# Patient Record
Sex: Male | Born: 1985 | Race: White | Hispanic: No | Marital: Single | Smoking: Former smoker
Health system: Southern US, Community
[De-identification: ages and names within clinical notes are randomized; demographics above are authoritative.]

---
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
## 2013-10-01 ENCOUNTER — Emergency Department (HOSPITAL_COMMUNITY): Payer: Self-pay

## 2013-10-01 ENCOUNTER — Encounter (HOSPITAL_COMMUNITY): Payer: Self-pay | Admitting: Emergency Medicine

## 2013-10-01 ENCOUNTER — Emergency Department (HOSPITAL_COMMUNITY)
Admission: EM | Admit: 2013-10-01 | Discharge: 2013-10-02 | Disposition: A | Discharge status: Home / Self Care | Payer: Self-pay | Attending: Emergency Medicine | Admitting: Emergency Medicine

## 2013-10-01 DIAGNOSIS — Z87891 Personal history of nicotine dependence: Secondary | ICD-10-CM | POA: Insufficient documentation

## 2013-10-01 DIAGNOSIS — Y929 Unspecified place or not applicable: Secondary | ICD-10-CM | POA: Insufficient documentation

## 2013-10-01 DIAGNOSIS — Y9351 Activity, roller skating (inline) and skateboarding: Secondary | ICD-10-CM | POA: Insufficient documentation

## 2013-10-01 DIAGNOSIS — S42002A Fracture of unspecified part of left clavicle, initial encounter for closed fracture: Secondary | ICD-10-CM

## 2013-10-01 DIAGNOSIS — S42023A Displaced fracture of shaft of unspecified clavicle, initial encounter for closed fracture: Secondary | ICD-10-CM | POA: Insufficient documentation

## 2013-10-01 DIAGNOSIS — Z23 Encounter for immunization: Secondary | ICD-10-CM | POA: Insufficient documentation

## 2013-10-01 NOTE — ED Notes (Signed)
Pt states he wrecked his skateboard tonight and thinks he broke his left clavicle  Pt also has road rash noted

## 2013-10-02 MED ORDER — OXYCODONE-ACETAMINOPHEN 5-325 MG PO TABS: 1.0000 | ORAL_TABLET | ORAL | Status: DC | PRN

## 2013-10-02 MED ORDER — OXYCODONE-ACETAMINOPHEN 5-325 MG PO TABS
1.0000 | ORAL_TABLET | Freq: Once | ORAL | Status: AC
  Administered 2013-10-02: 1 via ORAL
  Filled 2013-10-02: qty 1

## 2013-10-02 MED ORDER — TETANUS-DIPHTH-ACELL PERTUSSIS 5-2.5-18.5 LF-MCG/0.5 IM SUSP
0.5000 mL | Freq: Once | INTRAMUSCULAR | Status: AC
  Administered 2013-10-02: 0.5 mL via INTRAMUSCULAR
  Filled 2013-10-02: qty 0.5

## 2013-10-02 NOTE — ED Provider Notes (Signed)
Medical screening examination/treatment/procedure(s) were performed by non-physician practitioner and as supervising physician I was immediately available for consultation/collaboration.   EKG Interpretation None        Garl Speigner, MD 10/02/13 2220 

## 2013-10-02 NOTE — ED Notes (Signed)
Pt has a ride home.  

## 2013-10-02 NOTE — ED Provider Notes (Signed)
CSN: [id number]     Arrival date & time 10/01/13  2200 History   First MD Initiated Contact with Patient 10/01/13 2314     Chief Complaint  Patient presents with  . Shoulder Injury     (Consider location/radiation/quality/duration/timing/severity/associated sxs/prior Treatment) HPI History provided by pt.   Pt fell off of his skateboard this evening and fell onto his anterior L shoulder.  Did not his his head and denies neck/back pain.  Developed severe L clavicle/shoulder pain shortly after injury.  Pain aggravated by minimal ROM. Has not taken anything for pain.  No associated LUE weakness/paresthesias.  Has several abrasions.  Most recent tetanus unknown.  History reviewed. No pertinent past medical history. History reviewed. No pertinent past surgical history. History reviewed. No pertinent family history. History  Substance Use Topics  . Smoking status: Former Games developer  . Smokeless tobacco: Not on file  . Alcohol Use: No    Review of Systems    Allergies  Review of patient's allergies indicates no known allergies.  Home Medications   Current Outpatient Rx  Name  Route  Sig  Dispense  Refill  . oxyCODONE-acetaminophen (PERCOCET/ROXICET) 5-325 MG per tablet   Oral   Take 1 tablet by mouth every 4 (four) hours as needed for severe pain.   20 tablet   0    BP 102/91  Pulse 95  Temp(Src) 98.2 F (36.8 C) (Oral)  Resp 20  SpO2 100% Physical Exam  Nursing note and vitals reviewed. Constitutional: He is oriented to person, place, and time. He appears well-developed and well-nourished. No distress.  HENT:  Head: Normocephalic and atraumatic.  Eyes:  Normal appearance  Neck: Normal range of motion.  Pulmonary/Chest: Effort normal.  Musculoskeletal: Normal range of motion.  Entire spine non-tender.  Clavicles asymmetric.  Tenderness L clavicle w/ guarding.  Tenderness L anterior shoulder as well. Pain w/ minimal passive flexion/abduction L shoulder.  Nml elbow and  wrist.  2+ radial pulse and distal sensation intact.      Neurological: He is alert and oriented to person, place, and time.  Skin:  Several superficial hemostatic abrasions  Psychiatric: He has a normal mood and affect. His behavior is normal.    ED Course  Procedures (including critical care time) Labs Review Labs Reviewed - No data to display Imaging Review Dg Clavicle Left  10/02/2013   CLINICAL DATA:  Fall.  Left shoulder pain and area of clavicle.  EXAM: LEFT CLAVICLE - 2+ VIEWS  COMPARISON:  None.  FINDINGS: There is a minimally displaced, mildly angulated fracture of the midshaft of the clavicle. Acromioclavicular joint remains approximated. Visualized portions of the lungs are clear.  IMPRESSION: Minimally displaced and angulated midshaft left clavicle fracture.   Electronically Signed   By: Sebastian Ache   On: 10/02/2013 00:47   Dg Shoulder Left  10/01/2013   CLINICAL DATA:  Fall, left shoulder pain  EXAM: LEFT SHOULDER - 2+ VIEW  COMPARISON:  None available  FINDINGS: There is no evidence of fracture or dislocation. There is no evidence of arthropathy or other focal bone abnormality. Soft tissues are unremarkable.  IMPRESSION: No acute traumatic injury about the shoulder.   Electronically Signed   By: Rise Mu M.D.   On: 10/01/2013 22:52     EKG Interpretation None      MDM   Final diagnoses:  Fracture of left clavicle    28yo M presents w/ L shoulder pain s/p fall from skateboard this evening.  Xray  shows L mid-clavicle fx w/ angulation.  Nursing staff provided him w/ shoulder sling.  I prescribed percocet for pain and recommended ice and ibuprofen.  Advised f/u w/ ortho.  Tetanus updated.      Otilio Miuatherine E Danine Hor, PA-C 10/02/13 [phone]

## 2013-10-02 NOTE — Discharge Instructions (Signed)
Take percocet as needed for severe pain.  Do not drive within four hours of taking this medication (may cause drowsiness or confusion).    Ice 2-3 times a day for 15-20 minutes.  Follow up with Dr. Shon BatonBrooks as soon as your able.   You may return to the ER if symptoms worsen or you have any other concerns.    Clavicle Fracture A clavicle fracture is a break in the collarbone. This is a common injury, especially in children. Collarbones do not harden until around the age of 27. Most collarbone fractures are treated with a simple arm sling. In some cases a figure-of-eight splint is used to help hold the broken bones in position. Although not often needed, surgery may be required if the bone fragments are not in the correct position (displaced).  HOME CARE INSTRUCTIONS   Apply ice to the injury for 15-20 minutes each hour while awake for 2 days. Put the ice in a plastic bag and place a towel between the bag of ice and your skin.  Wear the sling or splint constantly for as long as directed by your caregiver. You may remove the sling or splint for bathing or showering. Be sure to keep your shoulder in the same place as when the sling or splint is on. Do not lift your arm.  If a figure-of-eight splint is applied, it must be tightened by another person every day. Tighten it enough to keep the shoulders held back. Allow enough room to place the index finger between the body and strap. Loosen the splint immediately if you feel numbness or tingling in your hands.  Only take over-the-counter or prescription medicines for pain, discomfort, or fever as directed by your caregiver.  Avoid activities that irritate or increase the pain for 4 to 6 weeks after surgery.  Follow all instructions for follow-up with your caregiver. This includes any referrals, physical therapy, and rehabilitation. Any delay in obtaining necessary care could result in a delay or failure of the injury to heal properly. SEEK MEDICAL CARE IF:    You have pain and swelling that are not relieved with medications. SEEK IMMEDIATE MEDICAL CARE IF:  Your arm is numb, cold, or pale, even when the splint is loose. MAKE SURE YOU:   Understand these instructions.  Will watch your condition.  Will get help right away if you are not doing well or get worse. Document Released: 04/04/2005 Document Revised: 09/17/2011 Document Reviewed: 01/29/2008 [facility] Patient Information 2014 [place], [place].

## 2014-04-27 ENCOUNTER — Emergency Department (HOSPITAL_COMMUNITY)
Admission: EM | Admit: 2014-04-27 | Discharge: 2014-04-27 | Disposition: A | Discharge status: Home / Self Care | Payer: Self-pay | Attending: Emergency Medicine | Admitting: Emergency Medicine

## 2014-04-27 ENCOUNTER — Encounter (HOSPITAL_COMMUNITY): Payer: Self-pay | Admitting: Emergency Medicine

## 2014-04-27 ENCOUNTER — Emergency Department (HOSPITAL_COMMUNITY): Payer: Self-pay

## 2014-04-27 DIAGNOSIS — Z720 Tobacco use: Secondary | ICD-10-CM | POA: Insufficient documentation

## 2014-04-27 DIAGNOSIS — Y990 Civilian activity done for income or pay: Secondary | ICD-10-CM | POA: Insufficient documentation

## 2014-04-27 DIAGNOSIS — Y9289 Other specified places as the place of occurrence of the external cause: Secondary | ICD-10-CM | POA: Insufficient documentation

## 2014-04-27 DIAGNOSIS — Y93G1 Activity, food preparation and clean up: Secondary | ICD-10-CM | POA: Insufficient documentation

## 2014-04-27 DIAGNOSIS — S161XXA Strain of muscle, fascia and tendon at neck level, initial encounter: Secondary | ICD-10-CM | POA: Insufficient documentation

## 2014-04-27 DIAGNOSIS — X58XXXA Exposure to other specified factors, initial encounter: Secondary | ICD-10-CM | POA: Insufficient documentation

## 2014-04-27 MED ORDER — IBUPROFEN 800 MG PO TABS: 800.0000 mg | ORAL_TABLET | Freq: Once | ORAL | Status: DC

## 2014-04-27 MED ORDER — CYCLOBENZAPRINE HCL 10 MG PO TABS: 10.0000 mg | ORAL_TABLET | Freq: Two times a day (BID) | ORAL | Status: DC | PRN

## 2014-04-27 MED ORDER — KETOROLAC TROMETHAMINE 60 MG/2ML IM SOLN
60.0000 mg | Freq: Once | INTRAMUSCULAR | Status: AC
  Administered 2014-04-27: 60 mg via INTRAMUSCULAR
  Filled 2014-04-27: qty 2

## 2014-04-27 MED ORDER — NAPROXEN 500 MG PO TABS: 500.0000 mg | ORAL_TABLET | Freq: Two times a day (BID) | ORAL | Status: DC

## 2014-04-27 MED ORDER — DIAZEPAM 5 MG PO TABS
5.0000 mg | ORAL_TABLET | Freq: Once | ORAL | Status: AC
  Administered 2014-04-27: 5 mg via ORAL
  Filled 2014-04-27: qty 1

## 2014-04-27 MED ORDER — TRAMADOL HCL 50 MG PO TABS: 50.0000 mg | ORAL_TABLET | Freq: Four times a day (QID) | ORAL | Status: DC | PRN

## 2014-04-27 NOTE — ED Provider Notes (Signed)
CSN: [id number]     Arrival date & time 04/27/14  1502 History  This chart was scribed for Ronald Pena A Danton Palmateer, PA with Richardean Canalavid H Yao, MD by Tonye RoyaltyJoshua Chen, ED Scribe. This patient was seen in room WTR6/WTR6 and the patient's care was started at 3:47 PM.    Chief Complaint  Patient presents with  . Back Pain   The history is provided by the patient. No language interpreter was used.   HPI Comments: Ronald Pena is a 28 y.o. male who presents to the Emergency Department complaining of pain to his upper back and right shoulder area with onset 1 week ago. 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 He states pain is worse with movement of his arms or turning his neck. He states pain is better after sleeping but worsens over the course of the day. He states pain was worst on Friday when he had to work. He states a massage caused his muscles to spasm which continued for 4-5 hours. He states he had Sunday off and worked some on Monday, but was not able to finish his shift. He states he has not taken any medication besides for Flexeril to help him sleep. He denies or numbness pain to his legs.   History reviewed. No pertinent past medical history. History reviewed. No pertinent past surgical history. No family history on file. History  Substance Use Topics  . Smoking status: Current Every Day Smoker  . Smokeless tobacco: Not on file  . Alcohol Use: Yes     Comment: occa    Review of Systems  Musculoskeletal: Positive for back pain and neck pain.  Neurological: Negative for numbness.      Allergies  Review of patient's allergies indicates no known allergies.  Home Medications   Prior to Admission medications   Medication Sig Start Date End Date Taking? Authorizing Provider  oxyCODONE-acetaminophen (PERCOCET/ROXICET) 5-325 MG per tablet Take 1 tablet by mouth every 4 (four) hours as needed for severe pain. 10/02/13   Ronald Pena  E Schinlever, PA-C   BP 114/67  Pulse 94  Temp(Src) 98 F (36.7 C) (Oral)  Resp 20  SpO2 98% Physical Exam  Nursing note and vitals reviewed. Constitutional: He is oriented to person, place, and time. He appears well-developed and well-nourished.  HENT:  Head: Normocephalic and atraumatic.  Eyes: Conjunctivae are normal.  Neck: Normal range of motion. Neck supple.  Pulmonary/Chest: Effort normal.  Musculoskeletal: Normal range of motion.  Lower cervical spine tenderness, tenderness in over midline and over bilateral paraspinal muscles. Tenderness is mainly over C4-C5. Full range of motion of bilateral arms at shoulder joint. No pain with full flexion, abduction, no pain with internal/external rotation. Distal radial pulses are intact and equal bilaterally.  Neurological: He is alert and oriented to person, place, and time.  Tablet of 5 and equal bilateral grip strength. 5 out of 5 and equal bilateral strength in deltoids, biceps, triceps. Sensation intact over all dermatomes of the upper extremity  Skin: Skin is warm and dry.  Psychiatric: He has a normal mood and affect.    ED Course  Procedures (including critical care time) Labs Review Labs Reviewed - No data to display  Imaging Review No results found.   EKG Interpretation None     DIAGNOSTIC STUDIES: Oxygen Saturation is 98% on room air, normal by my interpretation.    COORDINATION OF CARE: 3:54 PM Discussed treatment plan  with patient at beside, the patient agrees with the plan and has no further questions at this time.   MDM   Final diagnoses:  Cervical strain, initial encounter   Patient with nontraumatic pain to the lower neck area. Tender midline and paravertebral. X-rays obtained and are negative. Suspect possibly muscle spasms. We'll try Flexeril, naproxen, Ultram. Followup with primary care Dr. if this is not improving. I this time neurovascularly intact, no indication for any further imaging.  Filed  Vitals:   04/27/14 1521 04/27/14 1809  BP: 114/67   Pulse: 94 80  Temp: 98 F (36.7 C)   TempSrc: Oral   Resp: 20   SpO2: 98% 96%     I personally performed the services described in this documentation, which was scribed in my presence. The recorded information has been reviewed and is accurate.    Ronald Pena A Bynum Mccullars, PA-C 04/27/14 1932

## 2014-04-27 NOTE — Discharge Instructions (Signed)
Naprosyn for pain and inflammation. Ultram for severe pain. Flexeril for spasms. Follow up with your doctor if your symptoms not improving for further evaluation and treatment.    Muscle Cramps and Spasms Muscle cramps and spasms occur when a muscle or muscles tighten and you have no control over this tightening (involuntary muscle contraction). They are a common problem and can develop in any muscle. The most common place is in the calf muscles of the leg. Both muscle cramps and muscle spasms are involuntary muscle contractions, but they also have differences:   Muscle cramps are sporadic and painful. They may last a few seconds to a quarter of an hour. Muscle cramps are often more forceful and last longer than muscle spasms.  Muscle spasms may or may not be painful. They may also last just a few seconds or much longer. CAUSES  It is uncommon for cramps or spasms to be due to a serious underlying problem. In many cases, the cause of cramps or spasms is unknown. Some common causes are:   Overexertion.   Overuse from repetitive motions (doing the same thing over and over).   Remaining in a certain position for a long period of time.   Improper preparation, form, or technique while performing a sport or activity.   Dehydration.   Injury.   Side effects of some medicines.   Abnormally low levels of the salts and ions in your blood (electrolytes), especially potassium and calcium. This could happen if you are taking water pills (diuretics) or you are pregnant.  Some underlying medical problems can make it more likely to develop cramps or spasms. These include, but are not limited to:   Diabetes.   Parkinson disease.   Hormone disorders, such as thyroid problems.   Alcohol abuse.   Diseases specific to muscles, joints, and bones.   Blood vessel disease where not enough blood is getting to the muscles.  HOME CARE INSTRUCTIONS   Stay well hydrated. Drink enough water  and fluids to keep your urine clear or pale yellow.  It may be helpful to massage, stretch, and relax the affected muscle.  For tight or tense muscles, use a warm towel, heating pad, or hot shower water directed to the affected area.  If you are sore or have pain after a cramp or spasm, applying ice to the affected area may relieve discomfort.  Put ice in a plastic bag.  Place a towel between your skin and the bag.  Leave the ice on for 15-20 minutes, 03-04 times a day.  Medicines used to treat a known cause of cramps or spasms may help reduce their frequency or severity. Only take over-the-counter or prescription medicines as directed by your caregiver. SEEK MEDICAL CARE IF:  Your cramps or spasms get more severe, more frequent, or do not improve over time.  MAKE SURE YOU:   Understand these instructions.  Will watch your condition.  Will get help right away if you are not doing well or get worse. Document Released: 12/15/2001 Document Revised: 10/20/2012 Document Reviewed: 06/11/2012 [facility] Patient Information 2015 [place], [place]. This information is not intended to replace advice given to you by your health care provider. Make sure you discuss any questions you have with your health care provider.   Emergency Department Resource Guide 1) Find a Doctor and Pay Out of Pocket Although you won't have to find out who is covered by your insurance plan, it is a good idea to ask around and get recommendations.  You will then need to call the office and see if the doctor you have chosen will accept you as a new patient and what types of options they offer for patients who are self-pay. Some doctors offer discounts or will set up payment plans for their patients who do not have insurance, but you will need to ask so you aren't surprised when you get to your appointment.  2) Contact Your Local Health Department Not all health departments have doctors that can see patients for sick visits,  but many do, so it is worth a call to see if yours does. If you don't know where your local health department is, you can check in your phone book. The CDC also has a tool to help you locate your state's health department, and many state websites also have listings of all of their local health departments.  3) Find a Walk-in Clinic If your illness is not likely to be very severe or complicated, you may want to try a walk in clinic. These are popping up all over the country in pharmacies, drugstores, and shopping centers. They're usually staffed by nurse practitioners or physician assistants that have been trained to treat common illnesses and complaints. They're usually fairly quick and inexpensive. However, if you have serious medical issues or chronic medical problems, these are probably not your best option.  No Primary Care Doctor: - Call Health Connect at  [phone] - they can help you locate a primary care doctor that  accepts your insurance, provides certain services, etc. - Physician Referral Service- [phone]  Chronic Pain Problems: Organization         Address  Phone   Notes  [facility]  [phone] Patients need to be referred by their primary care doctor.   Medication Assistance: Organization         Address  Phone   Notes  [facility] Medication [facility] [address] [place] [place], [place] [address] [phone] --Must be a resident of [facility] -- Must have NO insurance coverage whatsoever (no Medicaid/ Medicare, etc.) -- The pt. MUST have a primary care doctor that directs their care regularly and follows them in the community   [facility]ist  [phone]   [employer]  [phone]    Agencies that provide inexpensive medical care: Organization         Address  Phone   Notes  [facility]  [phone]   [facility]    [phone]   [facility] [address] [place], [place] [address] [phone]   [facility] [address] [place] [phone]   Planned Parenthood    [phone]   [facility]    [phone]   Community Health and [facility]  [address], [place] Phone:  [phone], Fax:  [phone] Hours of Operation:  9 am - 6 pm, M-F.  Also accepts Medicaid/Medicare and self-pay.  [facility]  [address], [place] Phone: [phone], Fax: [phone]. Hours of Operation:  8:30 am - 5:30 pm, M-F.  Also accepts Medicaid and self-pay.  [facility] [place] [address], [place] Phone: [phone]   [facility] [address] Natasha Bence [place], [place] [phone], Ext. [phone] Mondays & Thursdays: 7-9 AM.  First 15 patients are seen on a first come,  first serve basis.    Medicaid-accepting [facility] Providers:  Organization         Address  Phone   Notes  [facility] [address], [place] [phone] Also accepts self-pay patients.  [facility] [address] Laurell Josephs [place], [place]  [phone]   [facility] [address], [place] [phone]   [facility] [address], [place] [phone]   Renaye Rakers [address], [place]   [phone] Only accepts [place] patients after they have their name applied to their card.   Self-Pay (no insurance) in [facility]:  Organization         Address  Phone   Notes  Sickle Cell Patients, [facility] [address] [place], [place] [phone]   [facility] [address] [place], [place] [phone]   [facility]  [address], [place] [phone]   [facility]/Dr. Osei-Bonsu  [address],  [place] or [address], [place] [phone] Phone number for both [place] and [place] locations is the same.  [facility]gent Medical and [facility] [address], [place] [phone]   [facility] [address], [place] or [address] [phone]   [facility] [address], [place] [phone], phone; [phone], fax Sees patients 1st and 3rd Saturday of every month.  Must not qualify for public or private insurance (i.e. Medicaid, Medicare, [place] Health Choice, Veterans' Benefits)  Household income should be no more than 200% of the poverty level The clinic cannot treat you if you are pregnant or think you are pregnant  Sexually transmitted diseases are not treated at the clinic.    Dental Care: Organization         Address  Phone  Notes  [facility] [address] [place], [place] [phone] Accepts children up to age 60 who are enrolled in [place] or [place] Health Choice; pregnant women with a Medicaid card; and children who have applied for Medicaid or [place] Health Choice, but were declined, whose parents can pay a reduced fee at time of service.  [facility] Department of [facility]  [address], [place] [phone] Accepts children up to age 71 who are enrolled in [place] or [place] Health Choice; pregnant women with a Medicaid card; and children who have applied for Medicaid or [place] Health Choice, but were declined, whose parents can pay a reduced fee at time of service.  [facility]  [address] [place], [place] [phone] Patients are seen by appointment only. Walk-ins are not accepted. [facility] will see patients 48 years of age and older. Monday - Tuesday (8am-5pm) Most Wednesdays (8:30-5pm) $30 per visit, cash only  [facility]  [address], [facility] [phone] Patients are seen by appointment only. Walk-ins are not accepted. [facility] will see patients 1 years of age and older. One Wednesday Evening (Monthly: Volunteer Based).  $30 per visit, cash only  [employer]  [phone] for adults; Children under age 40, call Graduate Pediatric Dentistry at [phone]. Children aged 52-14, please  call [phone]) [phone] to request a pediatric application.  Dental services are provided in all areas of dental care including fillings, crowns and bridges, complete and partial dentures, implants, gum treatment, root canals, and extractions. Preventive care is also provided. Treatment is provided to both adults and children. Patients are selected via a lottery and there is often a waiting list.   [facility] [address], [place]  [phone]) [phone] [email or web address]   [facility] [address], [place], [place] [phone], Ext. [phone] Second and Fourth Thursday of each month, opens at 6:30 AM; Clinic ends at 9 AM.  Patients are seen on a first-come first-served basis, and a limited number are seen during each clinic.   [facility]  [address], [place], [place] [phone]   Eligibility Requirements You must have lived in [place], [place], or [place] for at least the last three months.   You cannot be eligible for state or federal sponsored [place] Cityhealthcare insurance, including CIGNAVeterans Administration, [place]icaid, or [employer]icare.   You generally cannot be eligible for healthcare insurance through your employer.    How to apply: Eligibility screenings are held every Tuesday and Wednesday afternoon from 1:00 pm until 4:00 pm. You do not need an appointment for the interview!  [facility] [address], [place], [place] [phone]   [facility]  [phone]   [facility]  [phone]   [facility]  [phone]    Behavioral Health Resources in the Community: Intensive Outpatient Programs Organization         Address  Phone  Notes  [facility] [address], [place], [place] [phone]   [facility] [address], [place], [place] [phone]   [facility] [address], [place], [place]  [phone]   [facility] [address],  [place], [place] [phone] or [phone]   Substance Abuse Resources Organization         Address  Phone  Notes  [facility]  [phone]   [facility]  [phone]   [facility]  [phone]   [facility]  [phone]   Residential & Outpatient Substance Abuse Program  [phone]   Psychological Services Organization         Address  Phone  Notes  [facility]  [phone]   [facility]  [phone]- [phone]   [facility] [address], [place] [phone] or [phone]    Mobile Crisis Teams Organization         Address  Phone  Notes  [facility], Mobile Crisis Care Unit  [phone]   [facility]  [address] [place], [place] [phone]   Doristine LocksSharon DeEsch [address] [place] [phone]    Self-Help/Support Groups Organization         Address  Phone             Notes  [facility] [place] - variety of support groups  336- [phone] Call for more information  Narcotics Anonymous (NA), [facility] [address], [place]  2 meetings at this location   Statisticianesidential Treatment Programs Organization         Address  Phone  Notes  [facility] [address],    [place]  [phone]   [facility]  [phone], [place], [place] [phone]   [facility] [address], [place]  [phone] Admissions: 8am-3pm M-F  [facility] [address]    [place], [place] [phone]   [facility] [address], [place], [place] [phone]   [facility] [address]  [place], [place] [phone]   [facility] - Intensive Outpatient [address], [place], [place] [phone]   [facility] ([facility] [address]  [place], [place] [phone] or [phone]   [facility] ([facility]) [address] [place], [place] [phone] Accepts Medicaid  [facility] [address]  [place] [phone] Substance Abuse/Addiction Treatment   [facility] Resources Organization         Address  Phone  Notes  [facility]  [phone](888) [phone]   Angie FavaJulie Brannon, PhD [address], [address] [place], [place]   [phone] or [phone]   [facility]   [address] [place], [place] [phone]   [facility] [address], [place], [place] [phone]) [phone] Insurance/Medicaid/sponsorship through [facility]  [facility] Families [address]                                    [place], [place] [phone] Therapy/tele-psych/case  [facility] [address]   [place], [place] [phone]    Dr. Lolly MustacheArfeen  [phone]   [facility]  [employer] [facility] 1) [address], [place] 2) [address], [place] 3)  [address], [place] [phone](336) [phone]  [phone](336) [phone]   [facility] Child Abuse Hotline [phone]) [phone] or [phone](336) [phone] (After Hours)

## 2014-04-27 NOTE — ED Provider Notes (Signed)
Medical screening examination/treatment/procedure(s) were performed by non-physician practitioner and as supervising physician I was immediately available for consultation/collaboration.   EKG Interpretation None        Richardean Canalavid H Yao, MD 04/27/14 2334

## 2014-04-27 NOTE — ED Notes (Signed)
Pt reports upper back pain x 1 week, denies any injury.  Pt reports it could be a pinch nerve.  Denies any tingling or numbness of his hands at this time

## 2014-11-24 ENCOUNTER — Encounter (HOSPITAL_COMMUNITY): Payer: Self-pay | Admitting: Emergency Medicine

## 2014-11-24 ENCOUNTER — Emergency Department (HOSPITAL_COMMUNITY)
Admission: EM | Admit: 2014-11-24 | Discharge: 2014-11-24 | Disposition: A | Discharge status: Home / Self Care | Payer: Self-pay | Attending: Emergency Medicine | Admitting: Emergency Medicine

## 2014-11-24 DIAGNOSIS — M549 Dorsalgia, unspecified: Secondary | ICD-10-CM

## 2014-11-24 DIAGNOSIS — Z87891 Personal history of nicotine dependence: Secondary | ICD-10-CM | POA: Insufficient documentation

## 2014-11-24 DIAGNOSIS — M545 Low back pain: Secondary | ICD-10-CM | POA: Insufficient documentation

## 2014-11-24 DIAGNOSIS — R109 Unspecified abdominal pain: Secondary | ICD-10-CM

## 2014-11-24 LAB — COMPREHENSIVE METABOLIC PANEL
ALT: 14 U/L — ABNORMAL LOW (ref 17–63)
AST: 16 U/L (ref 15–41)
Albumin: 3.7 g/dL (ref 3.5–5.0)
Alkaline Phosphatase: 58 U/L (ref 38–126)
Anion gap: 9 (ref 5–15)
BUN: 14 mg/dL (ref 6–20)
CO2: 24 mmol/L (ref 22–32)
Calcium: 9 mg/dL (ref 8.9–10.3)
Chloride: 108 mmol/L (ref 101–111)
Creatinine, Ser: 0.99 mg/dL (ref 0.61–1.24)
GFR calc Af Amer: >60 mL/min (ref >60)
GFR calc non Af Amer: >60 mL/min (ref >60)
Glucose, Bld: 140 mg/dL — ABNORMAL HIGH (ref 65–99)
Potassium: 3.9 mmol/L (ref 3.5–5.1)
Sodium: 141 mmol/L (ref 135–145)
Total Bilirubin: 0.4 mg/dL (ref 0.3–1.2)
Total Protein: 6.5 g/dL (ref 6.5–8.1)

## 2014-11-24 LAB — CBC WITH DIFFERENTIAL/PLATELET
Basophils Absolute: 0 10*3/uL (ref 0.0–0.1)
Basophils Relative: 0 % (ref 0–1)
Eosinophils Absolute: 0.1 10*3/uL (ref 0.0–0.7)
Eosinophils Relative: 1 % (ref 0–5)
HCT: 39.1 % (ref 39.0–52.0)
Hemoglobin: 13.1 g/dL (ref 13.0–17.0)
Lymphocytes Relative: 28 % (ref 12–46)
Lymphs Abs: 3 10*3/uL (ref 0.7–4.0)
MCH: 29.4 pg (ref 26.0–34.0)
MCHC: 33.5 g/dL (ref 30.0–36.0)
MCV: 87.7 fL (ref 78.0–100.0)
Monocytes Absolute: 0.7 10*3/uL (ref 0.1–1.0)
Monocytes Relative: 6 % (ref 3–12)
Neutro Abs: 7 10*3/uL (ref 1.7–7.7)
Neutrophils Relative %: 65 % (ref 43–77)
Platelets: 264 10*3/uL (ref 150–400)
RBC: 4.46 MIL/uL (ref 4.22–5.81)
RDW: 12.6 % (ref 11.5–15.5)
WBC: 10.8 10*3/uL — ABNORMAL HIGH (ref 4.0–10.5)

## 2014-11-24 LAB — URINALYSIS, ROUTINE W REFLEX MICROSCOPIC
Bilirubin Urine: NEGATIVE
Glucose, UA: NEGATIVE mg/dL
Hgb urine dipstick: NEGATIVE
Ketones, ur: NEGATIVE mg/dL
Leukocytes, UA: NEGATIVE
Nitrite: NEGATIVE
Protein, ur: NEGATIVE mg/dL
Specific Gravity, Urine: 1.021 (ref 1.005–1.030)
Urobilinogen, UA: 0.2 mg/dL (ref 0.0–1.0)
pH: 7 (ref 5.0–8.0)

## 2014-11-24 LAB — URINE MICROSCOPIC-ADD ON

## 2014-11-24 MED ORDER — METHOCARBAMOL 500 MG PO TABS: 500.0000 mg | ORAL_TABLET | Freq: Two times a day (BID) | ORAL | Status: DC

## 2014-11-24 MED ORDER — NAPROXEN 500 MG PO TABS: 500.0000 mg | ORAL_TABLET | Freq: Two times a day (BID) | ORAL | Status: DC

## 2014-11-24 MED ORDER — IBUPROFEN 800 MG PO TABS
800.0000 mg | ORAL_TABLET | Freq: Once | ORAL | Status: AC
  Administered 2014-11-24: 800 mg via ORAL
  Filled 2014-11-24: qty 1

## 2014-11-24 MED ORDER — METHOCARBAMOL 500 MG PO TABS
1000.0000 mg | ORAL_TABLET | Freq: Once | ORAL | Status: DC
  Filled 2014-11-24: qty 2

## 2014-11-24 NOTE — Discharge Instructions (Signed)
Your lab work today did not show any acute abnormalities.  Your blood sugar was mildly elevated at 140, but not indicative of diabetes.  Is important for you to have follow-up with a primary care doctor if your symptoms continue.  Use the resource list below to locate a primary care doctor.  Take medications as prescribed.  Return to the emergency room for worsening condition or new concerning symptoms.   Back Exercises Back exercises help treat and prevent back injuries. The goal of back exercises is to increase the strength of your abdominal and back muscles and the flexibility of your back. These exercises should be started when you no longer have back pain. Back exercises include:  Pelvic Tilt. Lie on your back with your knees bent. Tilt your pelvis until the lower part of your back is against the floor. Hold this position 5 to 10 sec and repeat 5 to 10 times.  Knee to Chest. Pull first 1 knee up against your chest and hold for 20 to 30 seconds, repeat this with the other knee, and then both knees. This may be done with the other leg straight or bent, whichever feels better.  Sit-Ups or Curl-Ups. Bend your knees 90 degrees. Start with tilting your pelvis, and do a partial, slow sit-up, lifting your trunk only 30 to 45 degrees off the floor. Take at least 2 to 3 seconds for each sit-up. Do not do sit-ups with your knees out straight. If partial sit-ups are difficult, simply do the above but with only tightening your abdominal muscles and holding it as directed.  Hip-Lift. Lie on your back with your knees flexed 90 degrees. Push down with your feet and shoulders as you raise your hips a couple inches off the floor; hold for 10 seconds, repeat 5 to 10 times.  Back arches. Lie on your stomach, propping yourself up on bent elbows. Slowly press on your hands, causing an arch in your low back. Repeat 3 to 5 times. Any initial stiffness and discomfort should lessen with repetition over  time.  Shoulder-Lifts. Lie face down with arms beside your body. Keep hips and torso pressed to floor as you slowly lift your head and shoulders off the floor. Do not overdo your exercises, especially in the beginning. Exercises may cause you some mild back discomfort which lasts for a few minutes; however, if the pain is more severe, or lasts for more than 15 minutes, do not continue exercises until you see your caregiver. Improvement with exercise therapy for back problems is slow.  See your caregivers for assistance with developing a proper back exercise program. Document Released: 08/02/2004 Document Revised: 09/17/2011 Document Reviewed: 04/26/2011 [facility] Patient Information 2015 [employer], [employer]. This information is not intended to replace advice given to you by your health care provider. Make sure you discuss any questions you have with your health care provider.  Back Pain, Adult Low back pain is very common. About 1 in 5 people have back pain.The cause of low back pain is rarely dangerous. The pain often gets better over time.About half of people with a sudden onset of back pain feel better in just 2 weeks. About 8 in 10 people feel better by 6 weeks.  CAUSES Some common causes of back pain include:  Strain of the muscles or ligaments supporting the spine.  Wear and tear (degeneration) of the spinal discs.  Arthritis.  Direct injury to the back. DIAGNOSIS Most of the time, the direct cause of low back pain  is not known.However, back pain can be treated effectively even when the exact cause of the pain is unknown.Answering your caregiver's questions about your overall health and symptoms is one of the most accurate ways to make sure the cause of your pain is not dangerous. If your caregiver needs more information, he or she may order lab work or imaging tests (X-rays or MRIs).However, even if imaging tests show changes in your back, this usually does not require surgery. HOME CARE  INSTRUCTIONS For many people, back pain returns.Since low back pain is rarely dangerous, it is often a condition that people can learn to [facility] their own.   Remain active. It is stressful on the back to sit or stand in one place. Do not sit, drive, or stand in one place for more than 30 minutes at a time. Take short walks on level surfaces as soon as pain allows.Try to increase the length of time you walk each day.  Do not stay in bed.Resting more than 1 or 2 days can delay your recovery.  Do not avoid exercise or work.Your body is made to move.It is not dangerous to be active, even though your back may hurt.Your back will likely heal faster if you return to being active before your pain is gone.  Pay attention to your body when you bend and lift. Many people have less discomfortwhen lifting if they bend their knees, keep the load close to their bodies,and avoid twisting. Often, the most comfortable positions are those that put less stress on your recovering back.  Find a comfortable position to sleep. Use a firm mattress and lie on your side with your knees slightly bent. If you lie on your back, put a pillow under your knees.  Only take over-the-counter or prescription medicines as directed by your caregiver. Over-the-counter medicines to reduce pain and inflammation are often the most helpful.Your caregiver may prescribe muscle relaxant drugs.These medicines help dull your pain so you can more quickly return to your normal activities and healthy exercise.  Put ice on the injured area.  Put ice in a plastic bag.  Place a towel between your skin and the bag.  Leave the ice on for 15-20 minutes, 03-04 times a day for the first 2 to 3 days. After that, ice and heat may be alternated to reduce pain and spasms.  Ask your caregiver about trying back exercises and gentle massage. This may be of some benefit.  Avoid feeling anxious or stressed.Stress increases muscle tension and  can worsen back pain.It is important to recognize when you are anxious or stressed and learn ways to manage it.Exercise is a great option. SEEK MEDICAL CARE IF:  You have pain that is not relieved with rest or medicine.  You have pain that does not improve in 1 week.  You have new symptoms.  You are generally not feeling well. SEEK IMMEDIATE MEDICAL CARE IF:   You have pain that radiates from your back into your legs.  You develop new bowel or bladder control problems.  You have unusual weakness or numbness in your arms or legs.  You develop nausea or vomiting.  You develop abdominal pain.  You feel faint. Document Released: 06/25/2005 Document Revised: 12/25/2011 Document Reviewed: 10/27/2013 [facility] Patient Information 2015 [place], [place]. This information is not intended to replace advice given to you by your health care provider. Make sure you discuss any questions you have with your health care provider.    Emergency Department Resource Guide 1)  Find a Doctor and Pay Out of Pocket Although you won't have to find out who is covered by your insurance plan, it is a good idea to ask around and get recommendations. You will then need to call the office and see if the doctor you have chosen will accept you as a new patient and what types of options they offer for patients who are self-pay. Some doctors offer discounts or will set up payment plans for their patients who do not have insurance, but you will need to ask so you aren't surprised when you get to your appointment.  2) Contact Your Local Health Department Not all health departments have doctors that can see patients for sick visits, but many do, so it is worth a call to see if yours does. If you don't know where your local health department is, you can check in your phone book. The CDC also has a tool to help you locate your state's health department, and many state websites also have listings of all of their local health  departments.  3) Find a Walk-in Clinic If your illness is not likely to be very severe or complicated, you may want to try a walk in clinic. These are popping up all over the country in pharmacies, drugstores, and shopping centers. They're usually staffed by nurse practitioners or physician assistants that have been trained to treat common illnesses and complaints. They're usually fairly quick and inexpensive. However, if you have serious medical issues or chronic medical problems, these are probably not your best option.  No Primary Care Doctor: - Call Health Connect at  [phone] - they can help you locate a primary care doctor that  accepts your insurance, provides certain services, etc. - Physician Referral Service- [phone]  Chronic Pain Problems: Organization         Address  Phone   Notes  [facility]  [phone]) [phone] Patients need to be referred by their primary care doctor.   Medication Assistance: Organization         Address  Phone   Notes  [facility] Medication [facility] [address] [place], [place] [address] [phone]) [phone] --Must be a resident of [facility] [place] -- Must have NO insurance coverage whatsoever (no Medicaid/ Medicare, etc.) -- The pt. MUST have a primary care doctor that directs their care regularly and follows them in the community   MedAssist  [phone]   [employer]  [phone](888) [phone]    Agencies that provide inexpensive medical care: Organization         Address  Phone   Notes  [facility]  [phone](336) [phone]   [facility]    [phone](336) [phone]   [facility] [address] [place], [place] [address] [phone]) [phone]   [facility] [address], [place] [phone](336) [phone]   Planned Parenthood    [phone]) [phone]   [facility]    [phone]) [phone]   [facility]  [address], [place] Phone:  [phone](336)  [phone], Fax:  [phone] Hours of Operation:  9 am - 6 pm, M-F.  Also accepts Medicaid/Medicare and self-pay.  [facility]  [address], [place] Phone: [phone](336) [phone], Fax: [phone](336) [phone]. Hours of Operation:  8:30 am - 5:30 pm, M-F.  Also accepts Medicaid and self-pay.  [facility] [place] [address], [address] [place]  Phone: [phone]   [facility] [address] [place], [place] [phone], Ext. [phone] Mondays & Thursdays: 7-9 AM.  First 15 patients are seen on a first come, first serve basis.    Medicaid-accepting [facility] Providers:  Organization         Address  Phone   Notes  [facility] [address], [place] [phone] Also accepts self-pay patients.  [facility] [address] Laurell Josephs [place], [place]  [phone]   [facility] [address], [place] [phone]   [facility] [address] [place] [phone]   Renaye Rakers [address], [place]   [phone] Only accepts [place] patients after they have their name applied to their card.   Self-Pay (no insurance) in [facility]:  Organization         Address  Phone   Notes  Sickle Cell Patients, [facility] [address] [place], [place] [phone]   [facility] [address] [place], [place] [phone]   [facility] [place]  [address], [place] [phone]   [facility]/Dr. Osei-Bonsu  [address] [place] or [address], [place] [phone] Phone number for both [place] and [place] locations is the same.  [facility] [address] [place] [phone]   [facility] [address], [place] or [address] [phone]   [facility] [address], [place] [phone], phone; [phone], fax Sees patients 1st and 3rd Saturday of every month.  Must not qualify for public or private insurance (i.e. Medicaid, Medicare, [place] Health Choice, Veterans' Benefits)  Household income should be no more than 200% of the poverty level The clinic cannot treat you if you are pregnant or think you are pregnant  Sexually transmitted diseases are not treated at the clinic.    Dental Care: Organization         Address  Phone  Notes  [facility] [address] [place], [place] [phone] Accepts children up to age 6 who are enrolled in [place] or [place] Health Choice; pregnant women with a Medicaid card; and children who have applied for Medicaid or [place] Health Choice, but were declined, whose parents can pay a reduced fee at time of service.  [facility]  [address], [place] [phone] Accepts children up to age 32 who are enrolled in [place] or [place] Health Choice; pregnant women with a Medicaid card; and children who have applied for Medicaid or  Health Choice, but were declined, whose parents can pay a reduced fee at time of service.  [facility]  [address] [place], [place] [phone] Patients are seen by appointment only. Walk-ins are not accepted. [facility] will see patients 53 years of age and older. Monday - Tuesday (8am-5pm) Most Wednesdays (8:30-5pm) $30 per visit, cash only  [facility]  [address], [facility] [phone] Patients are seen by appointment only. Walk-ins are not accepted. [facility] will see patients 68 years of age and older. One Wednesday Evening (Monthly: Volunteer Based).  $  30 per visit, cash only  [employer] of Dentistry Clinics  [phone] for adults;  Children under age 81, call Graduate Pediatric Dentistry at [phone]. Children aged 8-14, please call [phone] to request a pediatric application.  Dental services are provided in all areas of dental care including fillings, crowns and bridges, complete and partial dentures, implants, gum treatment, root canals, and extractions. Preventive care is also provided. Treatment is provided to both adults and children. Patients are selected via a lottery and there is often a waiting list.   [facility] [address], [place]  [phone] [email or web address]   [facility] [address] [place], [place] [phone], Ext. [phone] Second and Fourth Thursday of each month, opens at 6:30 AM; Clinic ends at 9 AM.  Patients are seen on a first-come first-served basis, and a limited number are seen during each clinic.   [facility]  [address] Ether Griffins [place], [place] [phone]   Eligibility Requirements You must have lived in [place], [place], or [place] counties for at least the last three months.   You cannot be eligible for state or federal sponsored [place], including [employer], [place], or [employer].   You generally cannot be eligible for healthcare insurance through your employer.    How to apply: Eligibility screenings are held every Tuesday and Wednesday afternoon from 1:00 pm until 4:00 pm. You do not need an appointment for the interview!  [facility] [address], [place], [place] [phone]   [facility] Health Department  [phone]   [facility]  [phone]   [facility] Health Department  [phone]    Behavioral Health Resources in the Community: Intensive Outpatient Programs Organization         Address  Phone  Notes  [facility] [address] [place], [place] [phone]   [facility] Outpatient [address] [place], [place] [phone]   [facility] [address] [place], [place]  [phone]   [facility] [address],  [place], [place] [phone] or [phone]   Substance Abuse Resources Organization         Address  Phone  Notes  [facility]  [phone]   [facility]  [phone]   [facility] [place]  [phone]   Floydene Flock  [phone]   Residential & Outpatient Substance Abuse Program  [phone]   Psychological Services Organization         Address  Phone  Notes  [facility] Behavioral Health  [phone]   [facility] Services  [phone]   [facility] Mental Health [address], [place] [phone] or [phone]    Mobile Crisis Teams Organization         Address  Phone  Notes  [facility], Mobile Crisis Care Unit  [phone]   [facility]  [address] [place], [place] [phone]   Doristine Locks [address] [place] [phone]    Self-Help/Support Groups Organization         Address  Phone             Notes  [facility] [place] - variety of support groups  [phone]- [phone] Call for more information  Narcotics Anonymous (NA), [facility] [address], [employer] [place]  2 meetings at this location   Statistician  Address  Phone  Notes  [facility] [address],    [place]  [phone]   [facility]  [address], [place] [phone], [place], [place] [phone]   [facility] [address], [place] [phone] Admissions: 8am-3pm M-F  [facility] [address]    [place], [place] [phone]   [facility] [address], [place], [place] [phone]   [facility] [address]  [place], [place] [phone]   [facility] - Intensive  Outpatient [address], [place], [place] [phone]   [facility] ([facility] [address]  [place], [place] [phone] or [phone]   [facility] ([facility]) [address] [place], [place] [phone] Accepts Medicaid  [facility] [address]  [place] [phone] Substance Abuse/Addiction Treatment   [facility] Behavioral Health Resources Organization         Address  Phone  Notes  [facility]  [phone](888) [phone]   Angie FavaJulie Brannon, PhD [address] [place], [place]   [phone](336) [phone] or [phone]) [phone]   [facility]   [address] [place], [place] [phone](336) [phone]   [facility] [address], [place], [place] [phone](336) [phone] Insurance/Medicaid/sponsorship through [facility]  [facility] [address]                                    [place], [place] [phone](336) [phone] Therapy/tele-psych/case  [facility] [address].   [place], [place] [phone](336) [phone]    Dr. Lolly MustacheArfeen  [phone](336) [phone]   [facility] [place]  [employer] [facility] 1) [address], [place] 2) [address], [place] 3)  [address], [place] [phone](336) [phone](336) [phone]  [phone](336) [phone]   [facility] Child Abuse Hotline [phone](336) [phone] or [phone](336) [phone] (After Hours)

## 2014-11-24 NOTE — ED Provider Notes (Signed)
CSN: [id number]     Arrival date & time 11/24/14  0401 History   First MD Initiated Contact with Patient 11/24/14 0408     Chief Complaint  Patient presents with  . Back Pain     (Consider location/radiation/quality/duration/timing/severity/associated sxs/prior Treatment) HPI 29 year old male presents to the emergency department from home with complaint of intermittent back pain when he lies down for the last 6 weeks.  Patient reports that he feels that he has a kidney infection.  He denies any fever, chills, nausea, vomiting or diarrhea.  He denies any dysuria.  He has no prior history of kidney infection or kidney disease.  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  Patient has not taken anything to help with the pain.  He reports during the day.  He has no pain is only with lying down.  He reports that when he gets up after lying down.  He feels unwell "sick, with badness through his belly" History reviewed. No pertinent past medical history. History reviewed. No pertinent past surgical history. History reviewed. No pertinent family history. History  Substance Use Topics  . Smoking status: Former Games developermoker  . Smokeless tobacco: Not on file  . Alcohol Use: No     Comment: occa    Review of Systems   See History of Present Illness; otherwise all other systems are reviewed and negative  Allergies  Review of patient's allergies indicates no known allergies.  Home Medications   Prior to Admission medications   Medication Sig Start Date End Date Taking? Authorizing Provider  cyclobenzaprine (FLEXERIL) 10 MG tablet Take 1 tablet (10 mg total) by mouth 2 (two) times daily as needed for muscle spasms. Patient not taking: Reported on 11/24/2014 04/27/14   Tatyana Kirichenko, PA-C  naproxen (NAPROSYN) 500 MG tablet Take 1 tablet (500 mg total) by mouth 2 (two) times daily. Patient not taking: Reported on 11/24/2014 04/27/14   Tatyana Kirichenko, PA-C   traMADol (ULTRAM) 50 MG tablet Take 1 tablet (50 mg total) by mouth every 6 (six) hours as needed. Patient not taking: Reported on 11/24/2014 04/27/14   Tatyana Kirichenko, PA-C   BP 117/65 mmHg  Pulse 107  Temp(Src) 97.8 F (36.6 C) (Oral)  Resp 18  SpO2 100% Physical Exam  Constitutional: He is oriented to person, place, and time. He appears well-developed and well-nourished.  HENT:  Head: Normocephalic and atraumatic.  Nose: Nose normal.  Mouth/Throat: Oropharynx is clear and moist.  Eyes: Conjunctivae and EOM are normal. Pupils are equal, round, and reactive to light.  Neck: Normal range of motion. Neck supple. No JVD present. No tracheal deviation present. No thyromegaly present.  Cardiovascular: Normal rate, regular rhythm, normal heart sounds and intact distal pulses.  Exam reveals no gallop and no friction rub.   No murmur heard. Pulmonary/Chest: Effort normal and breath sounds normal. No stridor. No respiratory distress. He has no wheezes. He has no rales. He exhibits no tenderness.  Abdominal: Soft. Bowel sounds are normal. He exhibits no distension and no mass. There is no tenderness. There is no rebound and no guarding.  Musculoskeletal: Normal range of motion. He exhibits tenderness. He exhibits no edema.  Patient has some tenderness with palpation of left SI joint.  He has no CVA tenderness.  Lymphadenopathy:    He has no cervical adenopathy.  Neurological: He is alert and oriented to person, place, and time. He displays normal reflexes. He exhibits normal muscle tone.  Coordination normal.  Skin: Skin is warm and dry. No rash noted. No erythema. No pallor.  Psychiatric: He has a normal mood and affect. His behavior is normal. Judgment and thought content normal.  Nursing note and vitals reviewed.   ED Course  Procedures (including critical care time) Labs Review Labs Reviewed  URINALYSIS, ROUTINE W REFLEX MICROSCOPIC - Abnormal; Notable for the following:     APPearance TURBID (*)    All other components within normal limits  COMPREHENSIVE METABOLIC PANEL - Abnormal; Notable for the following:    Glucose, Bld 140 (*)    ALT 14 (*)    All other components within normal limits  CBC WITH DIFFERENTIAL/PLATELET - Abnormal; Notable for the following:    WBC 10.8 (*)    All other components within normal limits  URINE MICROSCOPIC-ADD ON    Imaging Review No results found.   EKG Interpretation None      MDM   Final diagnoses:  Back pain, unspecified location  Left flank pain    29 year old male with intermittent back pain for the last 4-6 weeks.  Patient feels that he has a kidney infection.  Plan for urinalysis and baseline labs.  Patient treated with NSAID.  5:35 AM Labs in urine are unremarkable.  Patient does not appear to have acute kidney injury, infection.  Patient will be given resource list for primary care doctor.  Marisa Severinlga Khamani Fairley, MD 11/24/14 [phone]

## 2014-11-24 NOTE — ED Notes (Signed)
Pt states he has been having intermittent low back pain for about a month to month and a half  Pt states he thinks he may having a kidney infection but denies any difficulty voiding

## 2014-12-06 ENCOUNTER — Emergency Department (HOSPITAL_COMMUNITY)
Admission: EM | Admit: 2014-12-06 | Discharge: 2014-12-06 | Disposition: A | Discharge status: Home / Self Care | Payer: Self-pay | Attending: Emergency Medicine | Admitting: Emergency Medicine

## 2014-12-06 ENCOUNTER — Encounter (HOSPITAL_COMMUNITY): Payer: Self-pay | Admitting: Emergency Medicine

## 2014-12-06 DIAGNOSIS — Y9389 Activity, other specified: Secondary | ICD-10-CM | POA: Insufficient documentation

## 2014-12-06 DIAGNOSIS — Y998 Other external cause status: Secondary | ICD-10-CM | POA: Insufficient documentation

## 2014-12-06 DIAGNOSIS — Y9289 Other specified places as the place of occurrence of the external cause: Secondary | ICD-10-CM | POA: Insufficient documentation

## 2014-12-06 DIAGNOSIS — S61412A Laceration without foreign body of left hand, initial encounter: Secondary | ICD-10-CM | POA: Insufficient documentation

## 2014-12-06 DIAGNOSIS — Z87891 Personal history of nicotine dependence: Secondary | ICD-10-CM | POA: Insufficient documentation

## 2014-12-06 DIAGNOSIS — Z23 Encounter for immunization: Secondary | ICD-10-CM | POA: Insufficient documentation

## 2014-12-06 DIAGNOSIS — W260XXA Contact with knife, initial encounter: Secondary | ICD-10-CM | POA: Insufficient documentation

## 2014-12-06 MED ORDER — TETANUS-DIPHTH-ACELL PERTUSSIS 5-2.5-18.5 LF-MCG/0.5 IM SUSP
0.5000 mL | Freq: Once | INTRAMUSCULAR | Status: AC
  Administered 2014-12-06: 0.5 mL via INTRAMUSCULAR
  Filled 2014-12-06: qty 0.5

## 2014-12-06 MED ORDER — LIDOCAINE-EPINEPHRINE 2 %-1:100000 IJ SOLN
20.0000 mL | Freq: Once | INTRAMUSCULAR | Status: AC
  Administered 2014-12-06: 20 mL
  Filled 2014-12-06: qty 1

## 2014-12-06 NOTE — ED Provider Notes (Signed)
CSN: [id number]     Arrival date & time 12/06/14  1940 History   First MD Initiated Contact with Patient 12/06/14 1955    This chart was scribed for non-physician practitioner, Antony Madura, PA, working with Rolan Bucco, MD by Marica Otter, ED Scribe. This patient was seen in room WTR5/WTR5 and the patient's care was started at 7:57 PM.  Chief Complaint  Patient presents with  . Extremity Laceration    L wrist   The history is provided by the patient. No language interpreter was used.   PCP: No PCP Per Patient HPI Comments: Ronald Pena is a 29 y.o. male who presents to the Emergency Department complaining of laceration to the anterior left wrist onset today around 4pm while pt was using a knife to fix a blowtorch. Pt also complains of associated throbbing pain to the left wrist rating his pain a 7 out of 10. Pain is constant and nonradiating. Pt's last tetanus shot is unknown. Pt denies numbness, tingling, or any other symptoms at this time.   History reviewed. No pertinent past medical history. History reviewed. No pertinent past surgical history. No family history on file. History  Substance Use Topics  . Smoking status: Former Games developer  . Smokeless tobacco: Not on file  . Alcohol Use: No     Comment: occa    Review of Systems  Constitutional: Negative for fever and chills.  Musculoskeletal:       Left wrist pain  Skin: Positive for wound (laceration to anterior left wrist ).  Neurological: Negative for numbness.  All other systems reviewed and are negative.   Allergies  Review of patient's allergies indicates no known allergies.  Home Medications   Prior to Admission medications   Medication Sig Start Date End Date Taking? Authorizing Provider  cyclobenzaprine (FLEXERIL) 10 MG tablet Take 1 tablet (10 mg total) by mouth 2 (two) times daily as needed for muscle spasms. Patient not taking: Reported on 11/24/2014 04/27/14   Tatyana Kirichenko, PA-C  methocarbamol (ROBAXIN)  500 MG tablet Take 1 tablet (500 mg total) by mouth 2 (two) times daily. Patient not taking: Reported on 12/06/2014 11/24/14   Marisa Severin, MD  naproxen (NAPROSYN) 500 MG tablet Take 1 tablet (500 mg total) by mouth 2 (two) times daily. Patient not taking: Reported on 12/06/2014 11/24/14   Marisa Severin, MD  traMADol (ULTRAM) 50 MG tablet Take 1 tablet (50 mg total) by mouth every 6 (six) hours as needed. Patient not taking: Reported on 11/24/2014 04/27/14   Jaynie Crumble, PA-C   Triage Vitals: BP 130/78 mmHg  Pulse 89  Temp(Src) 98.2 F (36.8 C) (Oral)  Resp 16  SpO2 100%  Physical Exam  Constitutional: He is oriented to person, place, and time. He appears well-developed and well-nourished. No distress.  Nontoxic/nonseptic appearing  HENT:  Head: Normocephalic and atraumatic.  Eyes: Conjunctivae and EOM are normal. No scleral icterus.  Neck: Normal range of motion.  Cardiovascular: Normal rate, regular rhythm and intact distal pulses.   Pulmonary/Chest: Effort normal. No respiratory distress.  Musculoskeletal: Normal range of motion.       Left hand: He exhibits tenderness and laceration. He exhibits normal range of motion, no bony tenderness, normal capillary refill and no deformity. Normal sensation noted. Normal strength noted.       Hands: 2+ distal radial pulse in left upper extremity  Cap refill brisk. Sensation to light touch intact in left hand.   Neurological: He is alert and oriented to person,  place, and time. He exhibits normal muscle tone. Coordination normal.  Skin: Skin is warm and dry. No rash noted. He is not diaphoretic. No erythema. No pallor.  1cm laceration to the dorsal left hand, lateral aspect of the left wrist. No active bleeding, no palpable pulsatile bleeding.   Psychiatric: He has a normal mood and affect. His behavior is normal.  Nursing note and vitals reviewed.   ED Course  Procedures (including critical care time) DIAGNOSTIC STUDIES: Oxygen  Saturation is 100% on RA, nl by my interpretation.    COORDINATION OF CARE: 8:06 PM-Discussed treatment plan which includes laceration repair with pt at bedside and pt agreed to plan. Pt explicitly declines any meds for pain including ibuprofen.   Labs Review Labs Reviewed - No data to display  Imaging Review No results found.   EKG Interpretation None      LACERATION REPAIR Performed by: Antony MaduraHUMES, Kurt Hoffmeier Authorized by: Antony MaduraHUMES, Angelica Wix Consent: Verbal consent obtained. Risks and benefits: risks, benefits and alternatives were discussed Consent given by: patient Patient identity confirmed: provided demographic data Prepped and Draped in normal sterile fashion Wound explored  Laceration Location: L hand  Laceration Length: 1cm  No Foreign Bodies seen or palpated  Anesthesia: local infiltration  Local anesthetic: lidocaine 2% with epinephrine  Anesthetic total: 3 ml  Irrigation method: syringe Amount of cleaning: standard  Skin closure: 4-0 ethilon  Number of sutures: 3  Technique: simple interrupted  Patient tolerance: Patient tolerated the procedure well with no immediate complications.  MDM   Final diagnoses:  Laceration of hand, left, initial encounter    Tdap booster given. Pressure irrigation performed. Laceration occurred < 8 hours prior to repair which was well tolerated. Pt has no comorbidities to effect normal wound healing. Discussed suture home care with pt and answered questions. Pt to follow up for wound check and suture removal in 10-12 days. Return precautions discussed and provided. Patient agreeable to plan with no unaddressed concerns.  I personally performed the services described in this documentation, which was scribed in my presence. The recorded information has been reviewed and is accurate.   Filed Vitals:   12/06/14 1948  BP: 130/78  Pulse: 89  Temp: 98.2 F (36.8 C)  TempSrc: Oral  Resp: 16  SpO2: 100%      Antony MaduraKelly Keino Placencia,  PA-C 12/06/14 2130  Rolan BuccoMelanie Belfi, MD 12/06/14 [phone]

## 2014-12-06 NOTE — Discharge Instructions (Signed)

## 2014-12-06 NOTE — ED Notes (Signed)
Pt c/o Laceration to Anterior L wrist after accidentally cutting himself with a knife around 3 or 4 this afternoon. Pt A&Ox4 and ambulatory. Pt has full ROM in arm, wrist, and fingers. Denies numbness or tingling. Pt has napkin wrapped with electrical tape to control bleeding.

## 2015-07-30 ENCOUNTER — Encounter (HOSPITAL_COMMUNITY): Payer: Self-pay | Admitting: Oncology

## 2015-07-30 ENCOUNTER — Emergency Department (HOSPITAL_COMMUNITY)
Admission: EM | Admit: 2015-07-30 | Discharge: 2015-07-31 | Disposition: A | Discharge status: Home / Self Care | Payer: Self-pay | Attending: Emergency Medicine | Admitting: Emergency Medicine

## 2015-07-30 DIAGNOSIS — L03115 Cellulitis of right lower limb: Secondary | ICD-10-CM | POA: Insufficient documentation

## 2015-07-30 DIAGNOSIS — L03116 Cellulitis of left lower limb: Secondary | ICD-10-CM | POA: Insufficient documentation

## 2015-07-30 NOTE — ED Notes (Signed)
Pt presents d/t b/l ankle pain, left side worse.  Redness noted to b/l ankles.  Denies injury.

## 2015-07-31 NOTE — ED Notes (Signed)
Pt called for room.  No answer.  Will attempt again.

## 2015-11-15 IMAGING — CR DG CLAVICLE*L*
2 series · 2 of 2 positions shown · non-contrast
Comparison: None.

CLINICAL DATA: Fall.  Left shoulder pain and area of clavicle.

EXAM:
LEFT CLAVICLE - 2+ VIEWS

[w clavicle ap left]
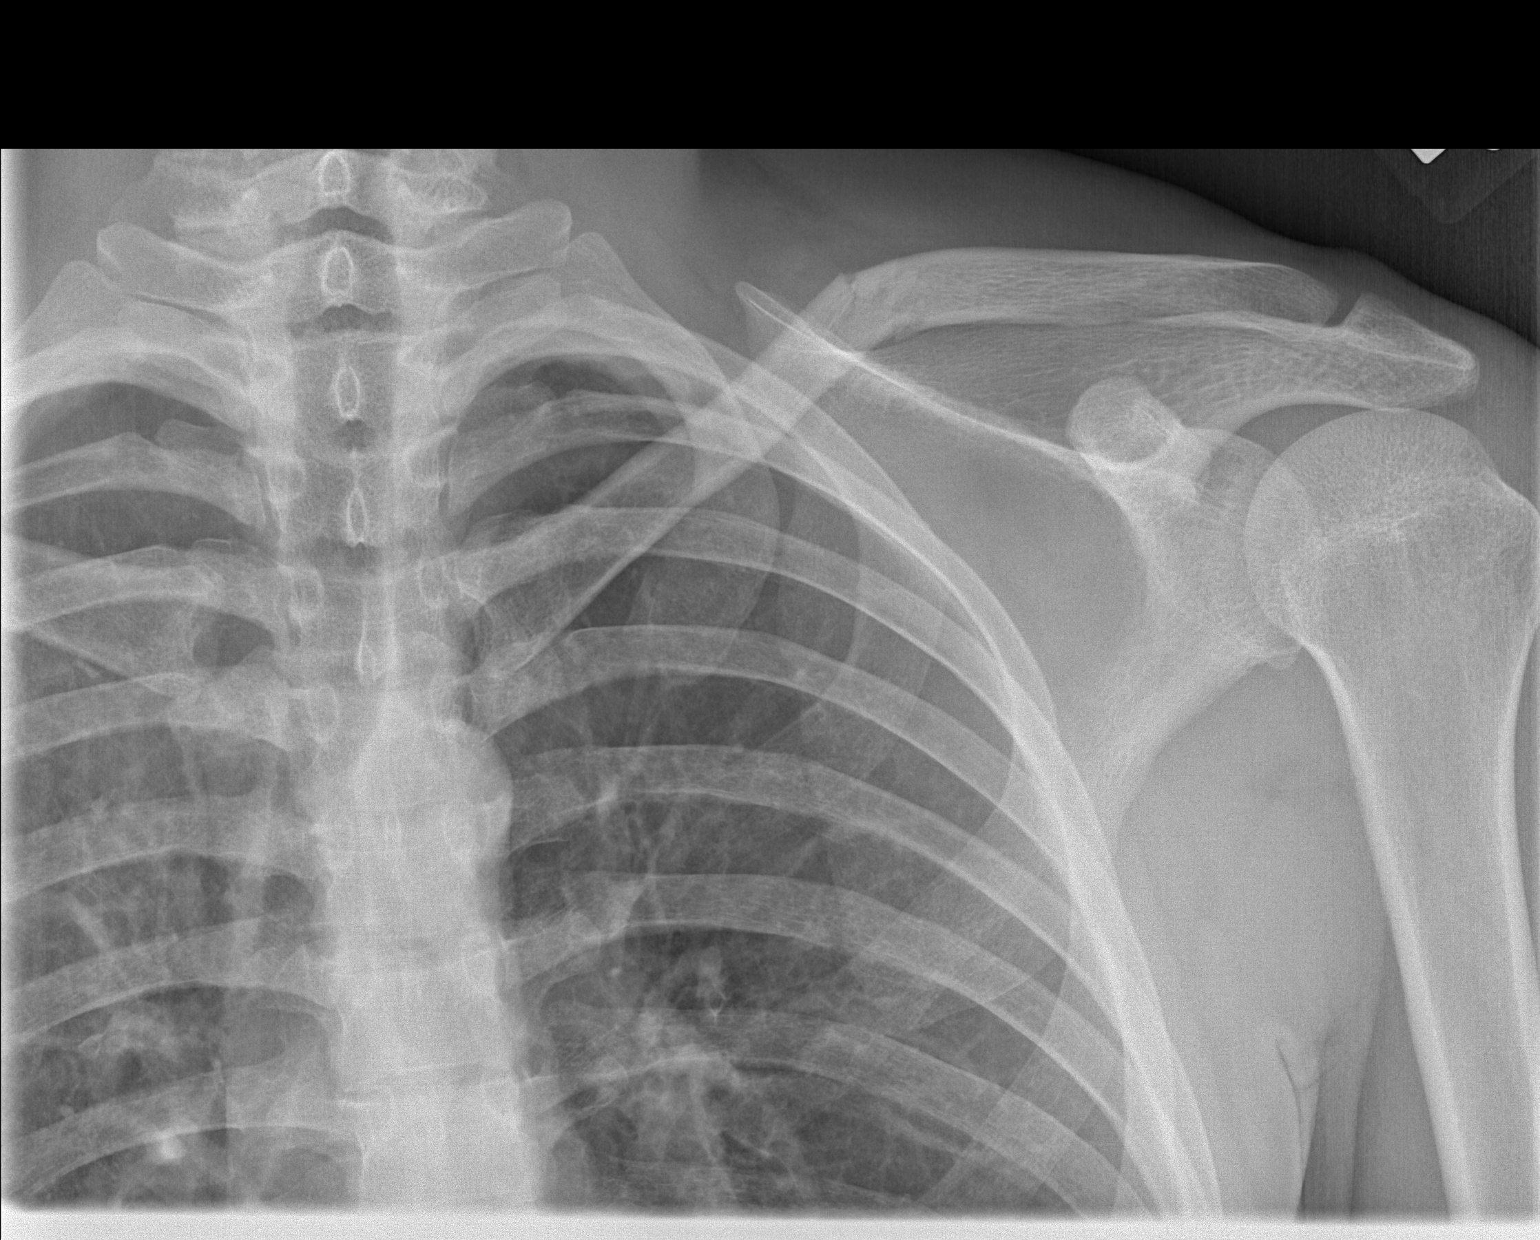

[w clavicle tangential left]
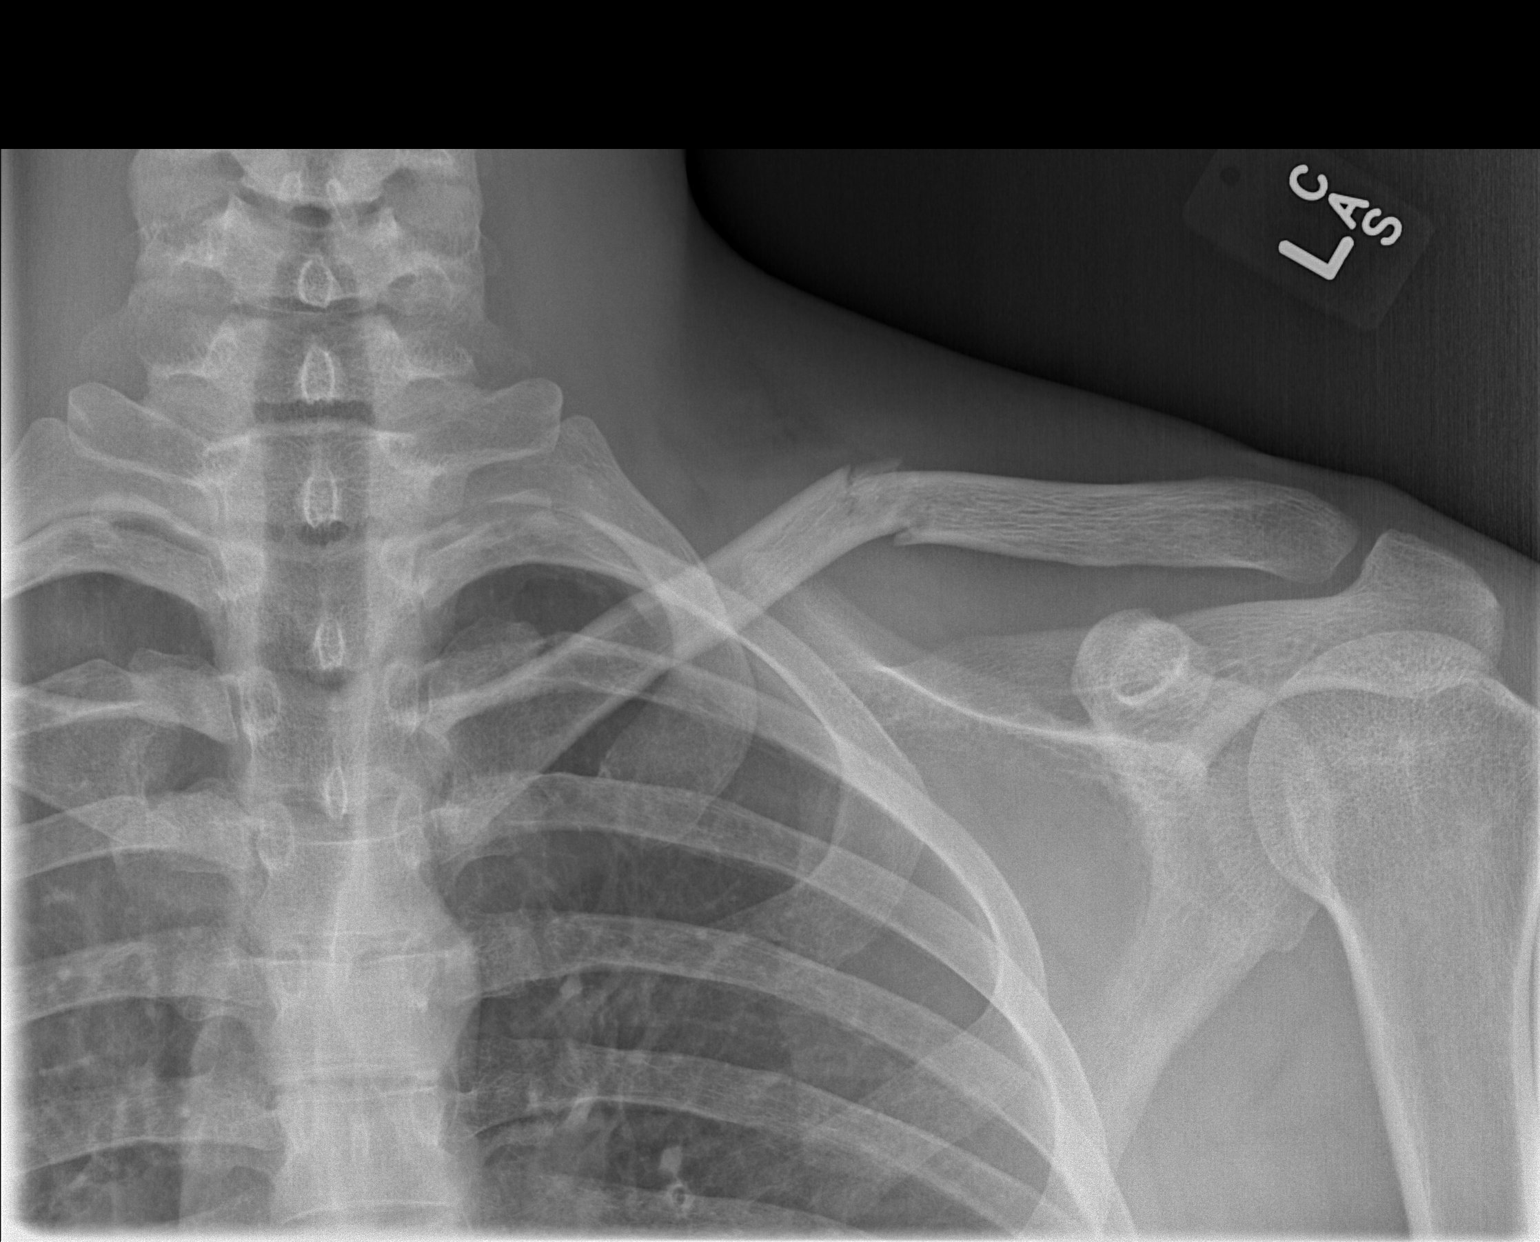

[2 of 2 positions shown; findings below may reference images not displayed]

FINDINGS: There is a minimally displaced, mildly angulated fracture of the
midshaft of the clavicle. Acromioclavicular joint remains
approximated. Visualized portions of the lungs are clear.
IMPRESSION: Minimally displaced and angulated midshaft left clavicle fracture.

## 2020-06-07 ENCOUNTER — Ambulatory Visit
Admission: EM | Admit: 2020-06-07 | Discharge: 2020-06-07 | Disposition: A | Discharge status: Home / Self Care | Payer: Self-pay | Attending: Emergency Medicine | Admitting: Emergency Medicine

## 2020-06-07 DIAGNOSIS — R300 Dysuria: Secondary | ICD-10-CM | POA: Insufficient documentation

## 2020-06-07 DIAGNOSIS — N5089 Other specified disorders of the male genital organs: Secondary | ICD-10-CM | POA: Insufficient documentation

## 2020-06-07 MED ORDER — VALACYCLOVIR HCL 1 G PO TABS: 1000.0000 mg | ORAL_TABLET | Freq: Two times a day (BID) | ORAL | 0 refills | Status: AC

## 2020-06-07 MED ORDER — DOXYCYCLINE HYCLATE 100 MG PO CAPS: 100.0000 mg | ORAL_CAPSULE | Freq: Two times a day (BID) | ORAL | 0 refills | Status: AC

## 2020-06-07 MED ORDER — CEFTRIAXONE SODIUM 500 MG IJ SOLR
500.0000 mg | Freq: Once | INTRAMUSCULAR | Status: AC
  Administered 2020-06-07: 500 mg via INTRAMUSCULAR

## 2020-06-07 NOTE — Discharge Instructions (Signed)
Today you received treatment for herpes, chlamydia, gonorhea. Testing for chlamydia, gonorrhea, trichomonas is pending: please look for these results on the MyChart app/website.  We will notify you if you are positive and outline treatment at that time.  Important to avoid all forms of sexual intercourse (oral, vaginal, anal) with any/all partners for the next 7 days to avoid spreading/reinfecting. Any/all sexual partners should be notified of testing/treatment today.  Return for persistent/worsening symptoms or if you develop fever, abdominal or pelvic pain, discharge, genital pain, blood in your urine, or are re-exposed to an STI.

## 2020-06-07 NOTE — ED Triage Notes (Signed)
Pt states thinks he has a STD. C/o burning when he urinates since Saturday and noticed bumps on penis today. Denies penile discharge.

## 2020-06-07 NOTE — ED Provider Notes (Addendum)
[facility]    CSN: [id number] Arrival date & time: 06/07/20  1910      History   Chief Complaint Chief Complaint  Patient presents with  . SEXUALLY TRANSMITTED DISEASE    HPI Ronald Pena is a 34 y.o. male  Presenting for genital rash that is painful and bumpy.  Erupted today.  Patient wanting STI testing.  Noting burning with urination since Saturday.  Denies penile discharge, testicular pain or swelling, penile pain or swelling.  History reviewed. No pertinent past medical history.  There are no problems to display for this patient.   History reviewed. No pertinent surgical history.     Home Medications    Prior to Admission medications   Medication Sig Start Date End Date Taking? Authorizing Provider  doxycycline (VIBRAMYCIN) 100 MG capsule Take 1 capsule (100 mg total) by mouth 2 (two) times daily for 7 days. 06/07/20 06/14/20  Hall-Potvin, [place], PA-C  valACYclovir (VALTREX) 1000 MG tablet Take 1 tablet (1,000 mg total) by mouth 2 (two) times daily for 7 days. 06/07/20 06/14/20  Hall-Potvin, [place], PA-C    Family History History reviewed. No pertinent family history.  Social History Social History   Tobacco Use  . Smoking status: Former Smoker    Types: E-cigarettes  . Smokeless tobacco: Never Used  Substance Use Topics  . Alcohol use: No    Comment: occa  . Drug use: Not Currently    Types: Marijuana     Allergies   Patient has no known allergies.   Review of Systems Review of Systems  Constitutional: Negative for fatigue and fever.  Gastrointestinal: Negative for abdominal pain and nausea.  Genitourinary: Positive for dysuria and genital sores. Negative for discharge, frequency, penile pain, penile swelling, scrotal swelling, testicular pain and urgency.  Musculoskeletal: Negative for arthralgias and myalgias.  Skin: Negative for color change and rash.     Physical Exam Triage Vital Signs ED Triage Vitals  Enc Vitals  Group     BP 06/07/20 2000 122/82     Pulse Rate 06/07/20 2000 (!) 106     Resp 06/07/20 2000 20     Temp 06/07/20 2000 98 F (36.7 C)     Temp Source 06/07/20 2000 Oral     SpO2 06/07/20 2000 96 %     Weight --      Height --      Head Circumference --      Peak Flow --      Pain Score 06/07/20 2009 0     Pain Loc --      Pain Edu? --      Excl. in GC? --    No data found.  Updated Vital Signs BP 122/82 (BP Location: Left Arm)   Pulse (!) 106   Temp 98 F (36.7 C) (Oral)   Resp 20   SpO2 96%   Visual Acuity Right Eye Distance:   Left Eye Distance:   Bilateral Distance:    Right Eye Near:   Left Eye Near:    Bilateral Near:     Physical Exam Constitutional:      General: He is not in acute distress. HENT:     Head: Normocephalic and atraumatic.  Eyes:     General: No scleral icterus.    Pupils: Pupils are equal, round, and reactive to light.  Cardiovascular:     Rate and Rhythm: Normal rate.  Pulmonary:     Effort: Pulmonary effort is normal. No respiratory  distress.     Breath sounds: No wheezing.  Genitourinary:    Comments: Maculovesicular lesions with surrounding erythema to ventral aspect of penile shaft and pubis. Skin:    Coloration: Skin is not jaundiced or pale.  Neurological:     Mental Status: He is alert and oriented to person, place, and time.      UC Treatments / Results  Labs (all labs ordered are listed, but only abnormal results are displayed) Labs Reviewed  HSV CULTURE AND TYPING  CYTOLOGY, (ORAL, ANAL, URETHRAL) ANCILLARY ONLY    EKG   Radiology No results found.  Procedures Procedures (including critical care time)  Medications Ordered in UC Medications  cefTRIAXone (ROCEPHIN) injection 500 mg (500 mg Intramuscular Given 06/07/20 2104)    Initial Impression / Assessment and Plan / UC Course  I have reviewed the triage vital signs and the nursing notes.  Pertinent labs & imaging results that were available during  my care of the patient were reviewed by me and considered in my medical decision making (see chart for details).     H&P concerning for genital herpes, STI.  Given dysuria will treat empirically for chlamydia, gonorrhea.  Cytology pending: We will treat for concurrent infection if indicated.  Return precautions discussed, pt verbalized understanding and is agreeable to plan. Final Clinical Impressions(s) / UC Diagnoses   Final diagnoses:  Dysuria  Genital lesion, male     Discharge Instructions     Today you received treatment for herpes, chlamydia, gonorhea. Testing for chlamydia, gonorrhea, trichomonas is pending: please look for these results on the MyChart app/website.  We will notify you if you are positive and outline treatment at that time.  Important to avoid all forms of sexual intercourse (oral, vaginal, anal) with any/all partners for the next 7 days to avoid spreading/reinfecting. Any/all sexual partners should be notified of testing/treatment today.  Return for persistent/worsening symptoms or if you develop fever, abdominal or pelvic pain, discharge, genital pain, blood in your urine, or are re-exposed to an STI.    ED Prescriptions    Medication Sig Dispense Auth. Provider   doxycycline (VIBRAMYCIN) 100 MG capsule Take 1 capsule (100 mg total) by mouth 2 (two) times daily for 7 days. 14 capsule Hall-Potvin, [place], PA-C   valACYclovir (VALTREX) 1000 MG tablet Take 1 tablet (1,000 mg total) by mouth 2 (two) times daily for 7 days. 14 tablet Hall-Potvin, [place], PA-C     PDMP not reviewed this encounter.   Hall-Potvin, [place], PA-C 06/07/20 2044    Odette Fraction [place], [place] 06/07/20 2107

## 2020-06-09 ENCOUNTER — Telehealth (HOSPITAL_COMMUNITY): Payer: Self-pay | Admitting: Emergency Medicine

## 2020-06-09 LAB — CYTOLOGY, (ORAL, ANAL, URETHRAL) ANCILLARY ONLY
Chlamydia: NEGATIVE
Comment: NEGATIVE
Comment: NEGATIVE
Comment: NORMAL
Neisseria Gonorrhea: NEGATIVE
Trichomonas: POSITIVE — AB

## 2020-06-09 MED ORDER — METRONIDAZOLE 500 MG PO TABS
500.0000 mg | ORAL_TABLET | Freq: Two times a day (BID) | ORAL | 0 refills | Status: DC
Start: 2020-06-09 — End: 2020-08-07

## 2020-06-10 LAB — HSV CULTURE AND TYPING

## 2020-08-07 ENCOUNTER — Ambulatory Visit
Admission: EM | Admit: 2020-08-07 | Discharge: 2020-08-07 | Disposition: A | Discharge status: Home / Self Care | Payer: Medicaid Other | Attending: Emergency Medicine | Admitting: Emergency Medicine

## 2020-08-07 ENCOUNTER — Other Ambulatory Visit: Payer: Self-pay

## 2020-08-07 ENCOUNTER — Encounter: Payer: Self-pay | Admitting: *Deleted

## 2020-08-07 DIAGNOSIS — L0291 Cutaneous abscess, unspecified: Secondary | ICD-10-CM

## 2020-08-07 MED ORDER — DOXYCYCLINE HYCLATE 100 MG PO CAPS: 100.0000 mg | ORAL_CAPSULE | Freq: Two times a day (BID) | ORAL | 0 refills | Status: AC

## 2020-08-07 MED ORDER — KETOROLAC TROMETHAMINE 30 MG/ML IJ SOLN
30.0000 mg | Freq: Once | INTRAMUSCULAR | Status: AC
  Administered 2020-08-07: 30 mg via INTRAMUSCULAR

## 2020-08-07 NOTE — Discharge Instructions (Signed)
Keep area(s) clean and dry. °Apply hot compress / towel for 5-10 minutes 3-5 times daily. °Take antibiotic as prescribed with food - important to complete course. °Return for worsening pain, redness, swelling, discharge, fever. ° °Helpful prevention tips: °Keep nails short to avoid secondary skin infections. °Use new, clean razors when shaving. °Avoid antiperspirants - look for deodorants without aluminum. °Avoid wearing underwire bras as this can irritate the area further.  °

## 2020-08-07 NOTE — ED Triage Notes (Addendum)
C/O raised red pustule to right anterior forearm -- states he noticed when he woke up 3 days ago; since then surrounding area is become red, tender, and swollen.  Denies any fevers.

## 2020-08-07 NOTE — ED Provider Notes (Incomplete)
  [facility]    CSN: [id number] Arrival date & time: 08/07/20  1518      History   Chief Complaint Chief Complaint  Patient presents with  . Arm Lesion    HPI Ronald Pena is a 35 y.o. male  HPI  History reviewed. No pertinent past medical history.  There are no problems to display for this patient.   History reviewed. No pertinent surgical history.     Home Medications    Prior to Admission medications   Medication Sig Start Date End Date Taking? Authorizing Provider  metroNIDAZOLE (FLAGYL) 500 MG tablet Take 1 tablet (500 mg total) by mouth 2 (two) times daily. 06/09/20   LampteyBritta Mccreedy, MD    Family History Family History  Problem Relation Age of Onset  . Healthy Mother   . Healthy Father     Social History Social History   Tobacco Use  . Smoking status: Former Smoker    Types: Cigarettes  . Smokeless tobacco: Never Used  Vaping Use  . Vaping Use: Every day  Substance Use Topics  . Alcohol use: No  . Drug use: Yes    Types: Marijuana    Comment: occasionally     Allergies   Patient has no known allergies.   Review of Systems Review of Systems   Physical Exam Triage Vital Signs ED Triage Vitals [08/07/20 1526]  Enc Vitals Group     BP 113/87     Pulse Rate 80     Resp 16     Temp 97.9 F (36.6 C)     Temp Source Oral     SpO2 97 %     Weight      Height      Head Circumference      Peak Flow      Pain Score 1     Pain Loc      Pain Edu?      Excl. in GC?    No data found.  Updated Vital Signs BP 113/87   Pulse 80   Temp 97.9 F (36.6 C) (Oral)   Resp 16   SpO2 97%   Visual Acuity Right Eye Distance:   Left Eye Distance:   Bilateral Distance:    Right Eye Near:   Left Eye Near:    Bilateral Near:     Physical Exam   UC Treatments / Results  Labs (all labs ordered are listed, but only abnormal results are displayed) Labs Reviewed - No data to display  EKG   Radiology No results  found.  Procedures Procedures (including critical care time)  Medications Ordered in UC Medications - No data to display  Initial Impression / Assessment and Plan / UC Course  I have reviewed the triage vital signs and the nursing notes.  Pertinent labs & imaging results that were available during my care of the patient were reviewed by me and considered in my medical decision making (see chart for details).     *** Final Clinical Impressions(s) / UC Diagnoses   Final diagnoses:  None   Discharge Instructions   None    ED Prescriptions    None     PDMP not reviewed this encounter.

## 2021-04-13 ENCOUNTER — Encounter (HOSPITAL_COMMUNITY): Payer: Self-pay

## 2021-04-13 ENCOUNTER — Other Ambulatory Visit: Payer: Self-pay

## 2021-04-13 ENCOUNTER — Emergency Department (HOSPITAL_COMMUNITY)
Admission: EM | Admit: 2021-04-13 | Discharge: 2021-04-13 | Disposition: A | Discharge status: Home / Self Care | Payer: Medicaid Other | Attending: Emergency Medicine | Admitting: Emergency Medicine

## 2021-04-13 DIAGNOSIS — Z87891 Personal history of nicotine dependence: Secondary | ICD-10-CM | POA: Insufficient documentation

## 2021-04-13 DIAGNOSIS — M7989 Other specified soft tissue disorders: Secondary | ICD-10-CM

## 2021-04-13 DIAGNOSIS — R2243 Localized swelling, mass and lump, lower limb, bilateral: Secondary | ICD-10-CM | POA: Insufficient documentation

## 2021-04-13 DIAGNOSIS — R21 Rash and other nonspecific skin eruption: Secondary | ICD-10-CM | POA: Insufficient documentation

## 2021-04-13 LAB — CBC WITH DIFFERENTIAL/PLATELET
Abs Immature Granulocytes: 0.04 10*3/uL (ref 0.00–0.07)
Basophils Absolute: 0 10*3/uL (ref 0.0–0.1)
Basophils Relative: 0 %
Eosinophils Absolute: 0.1 10*3/uL (ref 0.0–0.5)
Eosinophils Relative: 1 %
HCT: 40.8 % (ref 39.0–52.0)
Hemoglobin: 13.5 g/dL (ref 13.0–17.0)
Immature Granulocytes: 0 %
Lymphocytes Relative: 26 %
Lymphs Abs: 2.7 10*3/uL (ref 0.7–4.0)
MCH: 29.4 pg (ref 26.0–34.0)
MCHC: 33.1 g/dL (ref 30.0–36.0)
MCV: 88.9 fL (ref 80.0–100.0)
Monocytes Absolute: 0.6 10*3/uL (ref 0.1–1.0)
Monocytes Relative: 6 %
Neutro Abs: 7 10*3/uL (ref 1.7–7.7)
Neutrophils Relative %: 67 %
Platelets: 252 10*3/uL (ref 150–400)
RBC: 4.59 MIL/uL (ref 4.22–5.81)
RDW: 12.5 % (ref 11.5–15.5)
WBC: 10.5 10*3/uL (ref 4.0–10.5)
nRBC: 0 % (ref 0.0–0.2)

## 2021-04-13 LAB — URINALYSIS, ROUTINE W REFLEX MICROSCOPIC
Bilirubin Urine: NEGATIVE
Glucose, UA: NEGATIVE mg/dL
Hgb urine dipstick: NEGATIVE
Ketones, ur: 5 mg/dL — AB
Leukocytes,Ua: NEGATIVE
Nitrite: NEGATIVE
Protein, ur: 30 mg/dL — AB
Specific Gravity, Urine: 1.023 (ref 1.005–1.030)
pH: 5 (ref 5.0–8.0)

## 2021-04-13 LAB — COMPREHENSIVE METABOLIC PANEL
ALT: 18 U/L (ref 0–44)
AST: 21 U/L (ref 15–41)
Albumin: 4.2 g/dL (ref 3.5–5.0)
Alkaline Phosphatase: 60 U/L (ref 38–126)
Anion gap: 5 (ref 5–15)
BUN: 15 mg/dL (ref 6–20)
CO2: 30 mmol/L (ref 22–32)
Calcium: 9.4 mg/dL (ref 8.9–10.3)
Chloride: 103 mmol/L (ref 98–111)
Creatinine, Ser: 1.18 mg/dL (ref 0.61–1.24)
GFR, Estimated: >60 mL/min (ref >60)
Glucose, Bld: 101 mg/dL — ABNORMAL HIGH (ref 70–99)
Potassium: 3.5 mmol/L (ref 3.5–5.1)
Sodium: 138 mmol/L (ref 135–145)
Total Bilirubin: 0.5 mg/dL (ref 0.3–1.2)
Total Protein: 7.4 g/dL (ref 6.5–8.1)

## 2021-04-13 LAB — LIPASE, BLOOD: Lipase: 47 U/L (ref 11–51)

## 2021-04-13 MED ORDER — DOXYCYCLINE HYCLATE 100 MG PO CAPS: 100.0000 mg | ORAL_CAPSULE | Freq: Two times a day (BID) | ORAL | 0 refills | Status: AC

## 2021-04-13 NOTE — Discharge Instructions (Addendum)
Likely stasis dermatitis, see information for home care. Recheck with PCP. Can take Doxycycline as prescribed for possible cellulitis with close recheck with PCP.

## 2021-04-13 NOTE — ED Triage Notes (Signed)
Pt complains of bilateral leg swelling and redness x 2 days and states that he may have cellulitis. Pt complains of a pain to his RUQ that has been there x 2 days.

## 2021-04-13 NOTE — ED Provider Notes (Signed)
[facility]  HOSPITAL-EMERGENCY DEPT Provider Note   CSN: [id number] Arrival date & time: 04/13/21  0459     History Chief Complaint  Patient presents with   Leg Swelling    Ronald Pena is a 35 y.o. male.  35 year old male with complaint of swelling and redness to bilateral lower extremities onset 48 hours ago without pain, concerned he may have cellulitis again due to redness to his anterior lower legs. Denies injuries to the legs, is a daily smoker. Swelling has improved since lying down, previously reports the elastic in his sweatpants had left indentations on his ankles. No other complaints or concerns.       History reviewed. No pertinent past medical history.  There are no problems to display for this patient.   History reviewed. No pertinent surgical history.     Family History  Problem Relation Age of Onset   Healthy Mother    Healthy Father     Social History   Tobacco Use   Smoking status: Former    Types: Cigarettes   Smokeless tobacco: Never  Vaping Use   Vaping Use: Every day  Substance Use Topics   Alcohol use: No   Drug use: Yes    Types: Marijuana    Comment: occasionally    Home Medications Prior to Admission medications   Medication Sig Start Date End Date Taking? Authorizing Provider  doxycycline (VIBRAMYCIN) 100 MG capsule Take 1 capsule (100 mg total) by mouth 2 (two) times daily. 04/13/21  Yes Jeannie Fend, PA-C    Allergies    Patient has no known allergies.  Review of Systems   Review of Systems  Constitutional:  Negative for fever.  Respiratory:  Negative for shortness of breath.   Musculoskeletal:  Negative for arthralgias and myalgias.  Skin:  Positive for color change. Negative for wound.  Allergic/Immunologic: Negative for immunocompromised state.  Neurological:  Negative for weakness and numbness.  Hematological:  Negative for adenopathy.  All other systems reviewed and are negative.  Physical  Exam Updated Vital Signs BP 126/79 (BP Location: Right Arm)   Pulse 78   Temp 97.7 F (36.5 C) (Oral)   Resp 18   Ht 5\' 10"  (1.778 m)   Wt 59 kg   SpO2 100%   BMI 18.65 kg/m   Physical Exam Vitals and nursing note reviewed.  Constitutional:      General: He is not in acute distress.    Appearance: He is well-developed. He is not diaphoretic.  HENT:     Head: Normocephalic and atraumatic.  Cardiovascular:     Pulses: Normal pulses.  Pulmonary:     Effort: Pulmonary effort is normal.  Musculoskeletal:        General: No swelling, tenderness, deformity or signs of injury.     Right lower leg: No edema.     Left lower leg: No edema.  Skin:    General: Skin is warm and dry.     Findings: Erythema and rash present. No bruising.     Comments: Mild redness/petechia to bilateral lower extremities without swelling/tenderness of calves. DP pulses present. Skin intact, no ulcers, capillary refill normal.   Neurological:     Mental Status: He is alert and oriented to person, place, and time.     Sensory: No sensory deficit.     Motor: No weakness.  Psychiatric:        Behavior: Behavior normal.    ED Results / Procedures / Treatments  Labs (all labs ordered are listed, but only abnormal results are displayed) Labs Reviewed  COMPREHENSIVE METABOLIC PANEL - Abnormal; Notable for the following components:      Result Value   Glucose, Bld 101 (*)    All other components within normal limits  URINALYSIS, ROUTINE W REFLEX MICROSCOPIC - Abnormal; Notable for the following components:   Ketones, ur 5 (*)    Protein, ur 30 (*)    Bacteria, UA RARE (*)    All other components within normal limits  CBC WITH DIFFERENTIAL/PLATELET  LIPASE, BLOOD    EKG None  Radiology No results found.  Procedures Procedures   Medications Ordered in ED Medications - No data to display  ED Course  I have reviewed the triage vital signs and the nursing notes.  Pertinent labs & imaging  results that were available during my care of the patient were reviewed by me and considered in my medical decision making (see chart for details).  Clinical Course as of 04/13/21 0752  Thu Apr 13, 2021  1819 35 year old male with concern for recurrent cellulitis of the lower extremities. On exam, appears more consistent with stasis dermatitis. Labs without significant finding reviewed from triage work up- CBC, CMP UA, lipase. UA with small amount of ketones and protein without urinary complaints.  Will cover with doxy for his concern for cellulitis, discussed stasis and recommend recheck with PCP.  [LM]    Clinical Course User Index [LM] Alden Hipp   MDM Rules/Calculators/A&P                           Final Clinical Impression(s) / ED Diagnoses Final diagnoses:  Leg swelling    Rx / DC Orders ED Discharge Orders          Ordered    doxycycline (VIBRAMYCIN) 100 MG capsule  2 times daily        04/13/21 0725             Jeannie Fend, PA-C 04/13/21 0752    Terrilee Files, MD 04/13/21 [id number]

## 2021-04-13 NOTE — ED Notes (Signed)
Pt care taken, said the redness in his legs started 48 hours ago, pain in the lower abdomen started then as well
